# Patient Record
Sex: Female | Born: 1960 | Race: White | Hispanic: No | Smoking: Never smoker
Health system: Southern US, Community
[De-identification: ages and names within clinical notes are randomized; demographics above are authoritative.]

## PROBLEM LIST (undated history)

## (undated) DIAGNOSIS — K219 Gastro-esophageal reflux disease without esophagitis: Secondary | ICD-10-CM

## (undated) DIAGNOSIS — K30 Functional dyspepsia: Secondary | ICD-10-CM

## (undated) DIAGNOSIS — Z86010 Personal history of colonic polyps: Secondary | ICD-10-CM

## (undated) DIAGNOSIS — E785 Hyperlipidemia, unspecified: Secondary | ICD-10-CM

## (undated) HISTORY — DX: Functional dyspepsia: K30

## (undated) HISTORY — DX: Hyperlipidemia, unspecified: E78.5

## (undated) HISTORY — DX: Personal history of colonic polyps: Z86.010

## (undated) HISTORY — DX: Gastro-esophageal reflux disease without esophagitis: K21.9

---

## 2003-11-22 ENCOUNTER — Ambulatory Visit (HOSPITAL_COMMUNITY): Admission: RE | Admit: 2003-11-22 | Discharge: 2003-11-22 | Payer: Self-pay | Admitting: Infectious Diseases

## 2006-06-08 ENCOUNTER — Other Ambulatory Visit: Admission: RE | Admit: 2006-06-08 | Discharge: 2006-06-08 | Payer: Self-pay | Admitting: Family Medicine

## 2006-06-24 ENCOUNTER — Encounter: Admission: RE | Admit: 2006-06-24 | Discharge: 2006-06-24 | Payer: Self-pay | Admitting: Family Medicine

## 2006-07-01 ENCOUNTER — Encounter: Admission: RE | Admit: 2006-07-01 | Discharge: 2006-07-01 | Payer: Self-pay | Admitting: Family Medicine

## 2009-11-05 ENCOUNTER — Emergency Department (HOSPITAL_COMMUNITY): Admission: EM | Admit: 2009-11-05 | Discharge: 2009-11-05 | Payer: Self-pay | Admitting: Family Medicine

## 2013-03-09 HISTORY — PX: COLONOSCOPY: SHX174

## 2013-03-09 HISTORY — PX: POLYPECTOMY: SHX149

## 2013-06-21 ENCOUNTER — Other Ambulatory Visit: Payer: Self-pay | Admitting: Family Medicine

## 2013-06-21 ENCOUNTER — Other Ambulatory Visit (HOSPITAL_COMMUNITY)
Admission: RE | Admit: 2013-06-21 | Discharge: 2013-06-21 | Disposition: A | Discharge status: Home / Self Care | Payer: 59 | Source: Ambulatory Visit | Attending: Family Medicine | Admitting: Family Medicine

## 2013-06-21 DIAGNOSIS — Z124 Encounter for screening for malignant neoplasm of cervix: Secondary | ICD-10-CM | POA: Insufficient documentation

## 2013-06-29 ENCOUNTER — Encounter: Payer: Self-pay | Admitting: Internal Medicine

## 2013-07-07 ENCOUNTER — Ambulatory Visit (AMBULATORY_SURGERY_CENTER): Payer: Self-pay | Admitting: *Deleted

## 2013-07-07 VITALS — Ht 61.0 in | Wt 115.2 lb

## 2013-07-07 DIAGNOSIS — Z1211 Encounter for screening for malignant neoplasm of colon: Secondary | ICD-10-CM

## 2013-07-07 MED ORDER — PREPOPIK 10-3.5-12 MG-GM-GM PO PACK: 1.0000 | PACK | Freq: Once | ORAL | Status: DC

## 2013-07-07 NOTE — Progress Notes (Signed)
No allergies to eggs or soy. No problems with anesthesia.  Pt given Emmi instructions for colonoscopy  No oxygen use  No diet drug use  No previous colonoscopy

## 2013-07-21 ENCOUNTER — Ambulatory Visit (AMBULATORY_SURGERY_CENTER): Payer: 59 | Admitting: Internal Medicine

## 2013-07-21 ENCOUNTER — Encounter: Payer: Self-pay | Admitting: Internal Medicine

## 2013-07-21 VITALS — BP 108/66 | HR 67 | Temp 98.7°F | Resp 33 | Wt 115.0 lb

## 2013-07-21 DIAGNOSIS — K5730 Diverticulosis of large intestine without perforation or abscess without bleeding: Secondary | ICD-10-CM

## 2013-07-21 DIAGNOSIS — D126 Benign neoplasm of colon, unspecified: Secondary | ICD-10-CM

## 2013-07-21 DIAGNOSIS — Z1211 Encounter for screening for malignant neoplasm of colon: Secondary | ICD-10-CM

## 2013-07-21 MED ORDER — SODIUM CHLORIDE 0.9 % IV SOLN: 500.0000 mL | INTRAVENOUS | Status: DC

## 2013-07-21 NOTE — Patient Instructions (Addendum)
  I found and removed one tiny polyp that looks benign. You also have a condition called diverticulosis - common and not usually a problem. Please read the handout provided.  I will let you know pathology results and when to have another routine colonoscopy by mail.  I appreciate the opportunity to care for you. Zyad Boomer E. Jenia Klepper, MD, FACG  YOU HAD AN ENDOSCOPIC PROCEDURE TODAY AT THE [facility]: Refer to the procedure report that was given to you for any specific questions about what was found during the examination.  If the procedure report does not answer your questions, please call your gastroenterologist to clarify.  If you requested that your care partner not be given the details of your procedure findings, then the procedure report has been included in a sealed envelope for you to review at your convenience later.  YOU SHOULD EXPECT: Some feelings of bloating in the abdomen. Passage of more gas than usual.  Walking can help get rid of the air that was put into your GI tract during the procedure and reduce the bloating. If you had a lower endoscopy (such as a colonoscopy or flexible sigmoidoscopy) you may notice spotting of blood in your stool or on the toilet paper. If you underwent a bowel prep for your procedure, then you may not have a normal bowel movement for a few days.  DIET: Your first meal following the procedure should be a light meal and then it is ok to progress to your normal diet.  A half-sandwich or bowl of soup is an example of a good first meal.  Heavy or fried foods are harder to digest and may make you feel nauseous or bloated.  Likewise meals heavy in dairy and vegetables can cause extra gas to form and this can also increase the bloating.  Drink plenty of fluids but you should avoid alcoholic beverages for 24 hours.  ACTIVITY: Your care partner should take you home directly after the procedure.  You should plan to take it easy, moving slowly for the rest  of the day.  You can resume normal activity the day after the procedure however you should NOT DRIVE or use heavy machinery for 24 hours (because of the sedation medicines used during the test).    SYMPTOMS TO REPORT IMMEDIATELY: A gastroenterologist can be reached at any hour.  During normal business hours, 8:30 AM to 5:00 PM Monday through Friday, call [phone].  After hours and on weekends, please call the GI answering service at [phone] who will take a message and have the physician on call contact you.   Following lower endoscopy (colonoscopy or flexible sigmoidoscopy):  Excessive amounts of blood in the stool  Significant tenderness or worsening of abdominal pains  Swelling of the abdomen that is new, acute  Fever of 100F or higher  FOLLOW UP: If any biopsies were taken you will be contacted by phone or by letter within the next 1-3 weeks.  Call your gastroenterologist if you have not heard about the biopsies in 3 weeks.  Our staff will call the home number listed on your records the next business day following your procedure to check on you and address any questions or concerns that you may have at that time regarding the information given to you following your procedure. This is a courtesy call and so if there is no answer at the home number and we have not heard from you through the emergency physician on call, we   will assume that you have returned to your regular daily activities without incident.  SIGNATURES/CONFIDENTIALITY: You and/or your care partner have signed paperwork which will be entered into your electronic medical record.  These signatures attest to the fact that that the information above on your After Visit Summary has been reviewed and is understood.  Full responsibility of the confidentiality of this discharge information lies with you and/or your care-partner. 

## 2013-07-21 NOTE — Progress Notes (Signed)
Pt stable to RR 

## 2013-07-21 NOTE — Progress Notes (Signed)
Called to room to assist during endoscopic procedure.  Patient ID and intended procedure confirmed with present staff. Received instructions for my participation in the procedure from the performing physician.  

## 2013-07-21 NOTE — Op Note (Signed)
[place]  [address] [place], [id number]   COLONOSCOPY PROCEDURE REPORT  PATIENT: Christie Henderson, Christie Henderson  MR#: [id number] BIRTHDATE: September 04, 1960 , 52  yrs. old GENDER: Female ENDOSCOPIST: Gatha Mayer, MD, [facility] REFERRED [id number] Tamala Julian, M.D. PROCEDURE DATE:  07/21/2013 PROCEDURE:   Colonoscopy with biopsy First Screening Colonoscopy - Avg.  risk and is 50 yrs.  old or older Yes.  Prior Negative Screening - Now for repeat screening. N/A  History of Adenoma - Now for follow-up colonoscopy & has been > or = to 3 yrs.  N/A  Polyps Removed Today? Yes. ASA CLASS:   Class I INDICATIONS:average risk screening and first colonoscopy. MEDICATIONS: propofol (Diprivan) 250mg  IV, MAC sedation, administered by CRNA, and These medications were titrated to patient response per physician's verbal order  DESCRIPTION OF PROCEDURE:   After the risks benefits and alternatives of the procedure were thoroughly explained, informed consent was obtained.  A digital rectal exam revealed no abnormalities of the rectum.   The LB [id number] and LB [id number]  endoscope was introduced through the anus and advanced to the cecum, which was identified by both the appendix and ileocecal valve. No adverse events experienced.   The quality of the prep was excellent using Suprep  The instrument was then slowly withdrawn as the colon was fully examined.  COLON FINDINGS: A diminutive sessile polyp was found at the cecum. A polypectomy was performed with cold forceps.  The resection was complete and the polyp tissue was completely retrieved.   There was severe diverticulosis noted in the sigmoid colon with associated angulation and muscular hypertrophy.   The colon mucosa was otherwise normal.  Retroflexed views revealed no abnormalities. The time to cecum=6 minutes 57 seconds.  Withdrawal time=9 minutes 30 seconds.  The scope was withdrawn and the procedure completed. COMPLICATIONS: There  were no complications.  ENDOSCOPIC IMPRESSION: 1.   Diminutive sessile polyp was found at the cecum; polypectomy was performed with cold forceps 2.   There was severe diverticulosis noted in the sigmoid colon 3.   The colon mucosa was otherwise normal - excellent prep - first colonoscopy  RECOMMENDATIONS: Timing of repeat colonoscopy will be determined by pathology findings. Likely 5-10 years   eSigned:  Gatha Mayer, MD, [facility] 07/21/2013 10:32 AM   cc: The Patient and Carol Ada, MD

## 2013-07-24 ENCOUNTER — Telehealth: Payer: Self-pay

## 2013-07-24 NOTE — Telephone Encounter (Signed)
  Follow up Call-  Call back number 07/21/2013  Post procedure Call Back phone  # [phone] cell  Permission to leave phone message Yes     Patient questions:  Do you have a fever, pain , or abdominal swelling? no Pain Score  0 *  Have you tolerated food without any problems? yes  Have you been able to return to your normal activities? yes  Do you have any questions about your discharge instructions: Diet   no Medications  no Follow up visit  no  Do you have questions or concerns about your Care? no  Actions: * If pain score is 4 or above: No action needed, pain <4.

## 2013-07-25 ENCOUNTER — Other Ambulatory Visit: Payer: Self-pay

## 2013-07-25 DIAGNOSIS — Z1231 Encounter for screening mammogram for malignant neoplasm of breast: Secondary | ICD-10-CM

## 2013-07-27 ENCOUNTER — Encounter: Payer: Self-pay | Admitting: Internal Medicine

## 2013-07-27 DIAGNOSIS — Z860100 Personal history of colon polyps, unspecified: Secondary | ICD-10-CM

## 2013-07-27 HISTORY — DX: Personal history of colonic polyps: Z86.010

## 2013-07-27 HISTORY — DX: Personal history of colon polyps, unspecified: Z86.0100

## 2013-07-27 NOTE — Progress Notes (Signed)
Quick Note:  Diminutive adenoma - repeat colonoscopy 7 years 2022 ______

## 2013-08-04 ENCOUNTER — Ambulatory Visit
Admission: RE | Admit: 2013-08-04 | Discharge: 2013-08-04 | Disposition: A | Discharge status: Home / Self Care | Payer: 59 | Source: Ambulatory Visit

## 2013-08-04 DIAGNOSIS — Z1231 Encounter for screening mammogram for malignant neoplasm of breast: Secondary | ICD-10-CM

## 2013-08-07 ENCOUNTER — Other Ambulatory Visit: Payer: Self-pay | Admitting: Family Medicine

## 2013-08-07 DIAGNOSIS — R928 Other abnormal and inconclusive findings on diagnostic imaging of breast: Secondary | ICD-10-CM

## 2013-08-16 ENCOUNTER — Ambulatory Visit
Admission: RE | Admit: 2013-08-16 | Discharge: 2013-08-16 | Disposition: A | Discharge status: Home / Self Care | Payer: 59 | Source: Ambulatory Visit | Attending: Family Medicine | Admitting: Family Medicine

## 2013-08-16 DIAGNOSIS — R928 Other abnormal and inconclusive findings on diagnostic imaging of breast: Secondary | ICD-10-CM

## 2015-04-29 ENCOUNTER — Other Ambulatory Visit: Payer: Self-pay

## 2015-04-29 DIAGNOSIS — Z1231 Encounter for screening mammogram for malignant neoplasm of breast: Secondary | ICD-10-CM

## 2015-05-06 DIAGNOSIS — H52223 Regular astigmatism, bilateral: Secondary | ICD-10-CM | POA: Diagnosis not present

## 2015-05-06 DIAGNOSIS — H524 Presbyopia: Secondary | ICD-10-CM | POA: Diagnosis not present

## 2015-05-06 DIAGNOSIS — H5203 Hypermetropia, bilateral: Secondary | ICD-10-CM | POA: Diagnosis not present

## 2015-05-10 ENCOUNTER — Ambulatory Visit
Admission: RE | Admit: 2015-05-10 | Discharge: 2015-05-10 | Disposition: A | Discharge status: Home / Self Care | Payer: 59 | Source: Ambulatory Visit

## 2015-05-10 DIAGNOSIS — Z1231 Encounter for screening mammogram for malignant neoplasm of breast: Secondary | ICD-10-CM

## 2015-05-28 DIAGNOSIS — L821 Other seborrheic keratosis: Secondary | ICD-10-CM | POA: Diagnosis not present

## 2015-05-28 DIAGNOSIS — L720 Epidermal cyst: Secondary | ICD-10-CM | POA: Diagnosis not present

## 2015-05-28 DIAGNOSIS — D2262 Melanocytic nevi of left upper limb, including shoulder: Secondary | ICD-10-CM | POA: Diagnosis not present

## 2015-05-28 DIAGNOSIS — D225 Melanocytic nevi of trunk: Secondary | ICD-10-CM | POA: Diagnosis not present

## 2015-05-28 DIAGNOSIS — D2261 Melanocytic nevi of right upper limb, including shoulder: Secondary | ICD-10-CM | POA: Diagnosis not present

## 2015-05-28 DIAGNOSIS — D1801 Hemangioma of skin and subcutaneous tissue: Secondary | ICD-10-CM | POA: Diagnosis not present

## 2015-05-28 DIAGNOSIS — D2272 Melanocytic nevi of left lower limb, including hip: Secondary | ICD-10-CM | POA: Diagnosis not present

## 2015-06-05 DIAGNOSIS — Z0000 Encounter for general adult medical examination without abnormal findings: Secondary | ICD-10-CM | POA: Diagnosis not present

## 2015-06-05 DIAGNOSIS — Z13220 Encounter for screening for lipoid disorders: Secondary | ICD-10-CM | POA: Diagnosis not present

## 2015-06-05 DIAGNOSIS — R3915 Urgency of urination: Secondary | ICD-10-CM | POA: Diagnosis not present

## 2015-06-05 MED FILL — NITROFURANTOIN MONO-MCR 100: 100 | 7 days supply | Qty: 14 | Fill #0

## 2015-11-06 DIAGNOSIS — R350 Frequency of micturition: Secondary | ICD-10-CM | POA: Diagnosis not present

## 2015-11-06 MED FILL — NITROFURANTOIN MONO-MCR 100: 100 | 7 days supply | Qty: 14 | Fill #0

## 2016-05-28 DIAGNOSIS — D225 Melanocytic nevi of trunk: Secondary | ICD-10-CM | POA: Diagnosis not present

## 2016-05-28 DIAGNOSIS — L821 Other seborrheic keratosis: Secondary | ICD-10-CM | POA: Diagnosis not present

## 2016-05-28 DIAGNOSIS — L218 Other seborrheic dermatitis: Secondary | ICD-10-CM | POA: Diagnosis not present

## 2016-05-28 DIAGNOSIS — D1801 Hemangioma of skin and subcutaneous tissue: Secondary | ICD-10-CM | POA: Diagnosis not present

## 2016-05-28 DIAGNOSIS — L853 Xerosis cutis: Secondary | ICD-10-CM | POA: Diagnosis not present

## 2016-05-28 DIAGNOSIS — D2261 Melanocytic nevi of right upper limb, including shoulder: Secondary | ICD-10-CM | POA: Diagnosis not present

## 2016-05-28 DIAGNOSIS — D2272 Melanocytic nevi of left lower limb, including hip: Secondary | ICD-10-CM | POA: Diagnosis not present

## 2016-05-28 DIAGNOSIS — D2239 Melanocytic nevi of other parts of face: Secondary | ICD-10-CM | POA: Diagnosis not present

## 2017-09-01 ENCOUNTER — Other Ambulatory Visit (HOSPITAL_COMMUNITY)
Admission: RE | Admit: 2017-09-01 | Discharge: 2017-09-01 | Disposition: A | Discharge status: Home / Self Care | Payer: 59 | Source: Ambulatory Visit | Attending: Physician Assistant | Admitting: Physician Assistant

## 2017-09-01 ENCOUNTER — Other Ambulatory Visit: Payer: Self-pay | Admitting: Physician Assistant

## 2017-09-01 DIAGNOSIS — Z136 Encounter for screening for cardiovascular disorders: Secondary | ICD-10-CM | POA: Diagnosis not present

## 2017-09-01 DIAGNOSIS — Z0000 Encounter for general adult medical examination without abnormal findings: Secondary | ICD-10-CM | POA: Diagnosis not present

## 2017-09-01 DIAGNOSIS — Z23 Encounter for immunization: Secondary | ICD-10-CM | POA: Diagnosis not present

## 2017-09-06 LAB — CYTOLOGY - PAP
ADEQUACY: ABSENT
DIAGNOSIS: NEGATIVE
HPV: DETECTED — AB

## 2018-09-05 ENCOUNTER — Other Ambulatory Visit: Payer: Self-pay | Admitting: Physician Assistant

## 2018-09-05 ENCOUNTER — Other Ambulatory Visit (HOSPITAL_COMMUNITY)
Admission: RE | Admit: 2018-09-05 | Discharge: 2018-09-05 | Disposition: A | Discharge status: Home / Self Care | Payer: 59 | Source: Ambulatory Visit | Attending: Physician Assistant | Admitting: Physician Assistant

## 2018-09-05 DIAGNOSIS — Z0000 Encounter for general adult medical examination without abnormal findings: Secondary | ICD-10-CM | POA: Diagnosis not present

## 2018-09-05 DIAGNOSIS — E785 Hyperlipidemia, unspecified: Secondary | ICD-10-CM | POA: Diagnosis not present

## 2018-09-07 LAB — CYTOLOGY - PAP
Diagnosis: NEGATIVE
HPV: DETECTED — AB

## 2019-09-18 ENCOUNTER — Other Ambulatory Visit (HOSPITAL_COMMUNITY)
Admission: RE | Admit: 2019-09-18 | Discharge: 2019-09-18 | Disposition: A | Discharge status: Home / Self Care | Payer: 59 | Source: Ambulatory Visit | Attending: Physician Assistant | Admitting: Physician Assistant

## 2019-09-18 ENCOUNTER — Other Ambulatory Visit: Payer: Self-pay | Admitting: Physician Assistant

## 2019-09-18 DIAGNOSIS — Z0000 Encounter for general adult medical examination without abnormal findings: Secondary | ICD-10-CM | POA: Diagnosis not present

## 2019-09-18 DIAGNOSIS — Z1231 Encounter for screening mammogram for malignant neoplasm of breast: Secondary | ICD-10-CM

## 2019-09-18 DIAGNOSIS — E785 Hyperlipidemia, unspecified: Secondary | ICD-10-CM | POA: Diagnosis not present

## 2019-09-20 LAB — CYTOLOGY - PAP
Comment: NEGATIVE
Diagnosis: NEGATIVE
High risk HPV: NEGATIVE

## 2019-09-27 ENCOUNTER — Ambulatory Visit: Payer: 59

## 2019-10-09 DIAGNOSIS — R945 Abnormal results of liver function studies: Secondary | ICD-10-CM | POA: Diagnosis not present

## 2019-10-20 ENCOUNTER — Ambulatory Visit
Admission: RE | Admit: 2019-10-20 | Discharge: 2019-10-20 | Disposition: A | Discharge status: Home / Self Care | Payer: 59 | Source: Ambulatory Visit | Attending: Physician Assistant | Admitting: Physician Assistant

## 2019-10-20 ENCOUNTER — Other Ambulatory Visit: Payer: Self-pay

## 2019-10-20 DIAGNOSIS — Z1231 Encounter for screening mammogram for malignant neoplasm of breast: Secondary | ICD-10-CM

## 2020-01-15 DIAGNOSIS — E785 Hyperlipidemia, unspecified: Secondary | ICD-10-CM | POA: Diagnosis not present

## 2020-09-19 DIAGNOSIS — Z0000 Encounter for general adult medical examination without abnormal findings: Secondary | ICD-10-CM | POA: Diagnosis not present

## 2020-09-19 DIAGNOSIS — E785 Hyperlipidemia, unspecified: Secondary | ICD-10-CM | POA: Diagnosis not present

## 2020-11-19 ENCOUNTER — Encounter: Payer: Self-pay | Admitting: Internal Medicine

## 2021-09-26 DIAGNOSIS — Z13220 Encounter for screening for lipoid disorders: Secondary | ICD-10-CM | POA: Diagnosis not present

## 2021-09-26 DIAGNOSIS — R002 Palpitations: Secondary | ICD-10-CM | POA: Diagnosis not present

## 2021-09-26 DIAGNOSIS — Z1211 Encounter for screening for malignant neoplasm of colon: Secondary | ICD-10-CM | POA: Diagnosis not present

## 2021-09-26 DIAGNOSIS — Z0000 Encounter for general adult medical examination without abnormal findings: Secondary | ICD-10-CM | POA: Diagnosis not present

## 2021-11-12 ENCOUNTER — Other Ambulatory Visit: Payer: Self-pay | Admitting: Family Medicine

## 2021-11-12 DIAGNOSIS — Z1231 Encounter for screening mammogram for malignant neoplasm of breast: Secondary | ICD-10-CM

## 2021-11-14 ENCOUNTER — Ambulatory Visit
Admission: RE | Admit: 2021-11-14 | Discharge: 2021-11-14 | Disposition: A | Discharge status: Home / Self Care | Payer: 59 | Source: Ambulatory Visit | Attending: Family Medicine | Admitting: Family Medicine

## 2021-11-14 DIAGNOSIS — Z1231 Encounter for screening mammogram for malignant neoplasm of breast: Secondary | ICD-10-CM

## 2022-01-18 NOTE — Progress Notes (Unsigned)
Cardiology Office Note:    Date:  01/19/2022   ID:  Christie Henderson, DOB [birth year], MRN [id number]  PCP:  Carol Ada, MD  Cardiologist:  None  Electrophysiologist:  None   Referring MD: Caren Macadam, MD   Chief Complaint  Patient presents with   Palpitations    History of Present Illness:    Christie Henderson is a 61 y.o. female with a hx of hyperlipidemia who is referred by Dr. Mannie Stabile for evaluation of palpitations.  She reports she has been having intermittent palpitations.  Thinks it is likely PVCs.  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  Describes as feeling like heart skips beats.  Was occurring more frequently but has improved recently.  She denies any chest pain, dyspnea, lightheadedness, syncope, lower extremity edema, or palpitations.  Reports she walks at least 3 days/week for 30 to 45 minutes, denies any exertional symptoms.  No smoking history.  Family history includes maternal grandfather had MI in 13s.   Past Medical History:  Diagnosis Date   Indigestion    Personal history of colonic polyp - adenoma 07/27/2013    Past Surgical History:  Procedure Laterality Date   CESAREAN SECTION  2004    Current Medications: Current Meds  Medication Sig   calcium carbonate (TUMS - DOSED IN MG ELEMENTAL CALCIUM) 500 MG chewable tablet Chew 1 tablet by mouth as needed for indigestion or heartburn.   ibuprofen (ADVIL,MOTRIN) 200 MG tablet Take 200 mg by mouth every 6 (six) hours as needed.     Allergies:   Patient has no known allergies.   Social History   Socioeconomic History   Marital status: Legally Separated    Spouse name: Not on file   Number of children: Not on file   Years of education: Not on file   Highest education level: Not on file  Occupational History   Not on file  Tobacco Use   Smoking status: Never   Smokeless tobacco: Never  Substance and Sexual Activity   Alcohol use: Yes     Alcohol/week: 2.0 standard drinks of alcohol    Types: 2 Cans of beer per week   Drug use: No   Sexual activity: Not on file  Other Topics Concern   Not on file  Social History Narrative   Not on file   Social Determinants of Health   Financial Resource Strain: Not on file  Food Insecurity: Not on file  Transportation Needs: Not on file  Physical Activity: Not on file  Stress: Not on file  Social Connections: Not on file     Family History: The patient's family history includes Colon cancer in an other family member. There is no history of Esophageal cancer, Pancreatic cancer, Rectal cancer, or Stomach cancer.  ROS:   Please see the history of present illness.     All other systems reviewed and are negative.  EKGs/Labs/Other Studies Reviewed:    The following studies were reviewed today:   EKG:   01/19/2022: Normal sinus rhythm, rate 63, no ST abnormality  Recent Labs: No results found for requested labs within last 365 days.  Recent Lipid Panel No results found for: "CHOL", "TRIG", "HDL", "CHOLHDL", "VLDL", "LDLCALC", "LDLDIRECT"  Physical Exam:    VS:  BP 132/80   Pulse 63   Ht '5\' 1"'$  (1.549 m)   Wt 129 lb 6.4 oz (58.7 kg)   LMP 07/17/2013  SpO2 95%   BMI 24.45 kg/m     Wt Readings from Last 3 Encounters:  01/19/22 129 lb 6.4 oz (58.7 kg)  07/21/13 115 lb (52.2 kg)  07/07/13 115 lb 3.2 oz (52.3 kg)     GEN:  Well nourished, well developed in no acute distress HEENT: Normal NECK: No JVD; No carotid bruits LYMPHATICS: No lymphadenopathy CARDIAC: RRR, no murmurs, rubs, gallops RESPIRATORY:  Clear to auscultation without rales, wheezing or rhonchi  ABDOMEN: Soft, non-tender, non-distended MUSCULOSKELETAL:  No edema; No deformity  SKIN: Warm and dry NEUROLOGIC:  Alert and oriented x 3 PSYCHIATRIC:  Normal affect   ASSESSMENT:    1. Palpitations   2. Hyperlipidemia, unspecified hyperlipidemia type    PLAN:    Palpitations: Description suggests  likely PACs/PVCs.  Discussed monitor to quantify burden of ectopy but she reports her symptoms have improved and wishes to hold off for now.  If palpitations worsen we can plan for cardiac monitor  Hyperlipidemia: LDL 169 on 09/26/2021.  10-year ASCVD risk score 4%, not meeting indication for statin at this time.  Recommend calcium score for further risk stratification   RTC in 1 year  Medication Adjustments/Labs and Tests Ordered: Current medicines are reviewed at length with the patient today.  Concerns regarding medicines are outlined above.  Orders Placed This Encounter  Procedures   CT CARDIAC SCORING (SELF PAY ONLY)   EKG 12-Lead   No orders of the defined types were placed in this encounter.   Patient Instructions  Medication Instructions:  Your physician recommends that you continue on your current medications as directed. Please refer to the Current Medication list given to you today.  *If you need a refill on your cardiac medications before your next appointment, please call your pharmacy*  Testing/Procedures: CT coronary calcium score.   Test locations:  [place]   This is $99 out of pocket.   Coronary CalciumScan A coronary calcium scan is an imaging test used to look for deposits of calcium and other fatty materials (plaques) in the inner lining of the blood vessels of the heart (coronary arteries). These deposits of calcium and plaques can partly clog and narrow the coronary arteries without producing any symptoms or warning signs. This puts a person at risk for a heart attack. This test can detect these deposits before symptoms develop. Tell a health care provider about: Any allergies you have. All medicines you are taking, including vitamins, herbs, eye drops, creams, and over-the-counter medicines. Any problems you or family members have had with anesthetic medicines. Any blood disorders you have. Any surgeries you have  had. Any medical conditions you have. Whether you are pregnant or may be pregnant. What are the risks? Generally, this is a safe procedure. However, problems may occur, including: Harm to a pregnant woman and her unborn baby. This test involves the use of radiation. Radiation exposure can be dangerous to a pregnant woman and her unborn baby. If you are pregnant, you generally should not have this procedure done. Slight increase in the risk of cancer. This is because of the radiation involved in the test. What happens before the procedure? No preparation is needed for this procedure. What happens during the procedure? You will undress and remove any jewelry around your neck or chest. You will put on a hospital gown. Sticky electrodes will be placed on your chest. The electrodes will be connected to an electrocardiogram (ECG) machine to record a tracing of the electrical activity  of your heart. A CT scanner will take pictures of your heart. During this time, you will be asked to lie still and hold your breath for 2-3 seconds while a picture of your heart is being taken. The procedure may vary among health care providers and hospitals. What happens after the procedure? You can get dressed. You can return to your normal activities. It is up to you to get the results of your test. Ask your health care provider, or the department that is doing the test, when your results will be ready. Summary A coronary calcium scan is an imaging test used to look for deposits of calcium and other fatty materials (plaques) in the inner lining of the blood vessels of the heart (coronary arteries). Generally, this is a safe procedure. Tell your health care provider if you are pregnant or may be pregnant. No preparation is needed for this procedure. A CT scanner will take pictures of your heart. You can return to your normal activities after the scan is done. This information is not intended to replace advice given  to you by your health care provider. Make sure you discuss any questions you have with your health care provider. Document Released: 08/22/2007 Document Revised: 01/13/2016 Document Reviewed: 01/13/2016 Elsevier Interactive Patient Education  2017 [place]: At [facility], you and your health needs are our priority.  As part of our continuing mission to provide you with exceptional heart care, we have created designated Provider Care Teams.  These Care Teams include your primary Cardiologist (physician) and Advanced Practice Providers (APPs -  Physician Assistants and Nurse Practitioners) who all work together to provide you with the care you need, when you need it.  We recommend signing up for the patient portal called "MyChart".  Sign up information is provided on this After Visit Summary.  MyChart is used to connect with patients for Virtual Visits (Telemedicine).  Patients are able to view lab/test results, encounter notes, upcoming appointments, etc.  Non-urgent messages can be sent to your provider as well.   To learn more about what you can do with MyChart, go to [email or web address].    Your next appointment:   12 month(s)  The format for your next appointment:   In Person  Provider:   Dr. Gardiner Rhyme      Signed, Donato Heinz, MD  01/19/2022 1:26 PM    [place], [place]

## 2022-01-19 ENCOUNTER — Ambulatory Visit: Payer: 59 | Attending: Cardiology | Admitting: Cardiology

## 2022-01-19 ENCOUNTER — Encounter: Payer: Self-pay | Admitting: Cardiology

## 2022-01-19 VITALS — BP 132/80 | HR 63 | Ht 61.0 in | Wt 129.4 lb

## 2022-01-19 DIAGNOSIS — R002 Palpitations: Secondary | ICD-10-CM | POA: Diagnosis not present

## 2022-01-19 DIAGNOSIS — E785 Hyperlipidemia, unspecified: Secondary | ICD-10-CM | POA: Diagnosis not present

## 2022-01-19 NOTE — Patient Instructions (Signed)
Medication Instructions:  Your physician recommends that you continue on your current medications as directed. Please refer to the Current Medication list given to you today.  *If you need a refill on your cardiac medications before your next appointment, please call your pharmacy*  Testing/Procedures: CT coronary calcium score.   Test locations:  [place]   This is $99 out of pocket.   Coronary CalciumScan A coronary calcium scan is an imaging test used to look for deposits of calcium and other fatty materials (plaques) in the inner lining of the blood vessels of the heart (coronary arteries). These deposits of calcium and plaques can partly clog and narrow the coronary arteries without producing any symptoms or warning signs. This puts a person at risk for a heart attack. This test can detect these deposits before symptoms develop. Tell a health care provider about: Any allergies you have. All medicines you are taking, including vitamins, herbs, eye drops, creams, and over-the-counter medicines. Any problems you or family members have had with anesthetic medicines. Any blood disorders you have. Any surgeries you have had. Any medical conditions you have. Whether you are pregnant or may be pregnant. What are the risks? Generally, this is a safe procedure. However, problems may occur, including: Harm to a pregnant woman and her unborn baby. This test involves the use of radiation. Radiation exposure can be dangerous to a pregnant woman and her unborn baby. If you are pregnant, you generally should not have this procedure done. Slight increase in the risk of cancer. This is because of the radiation involved in the test. What happens before the procedure? No preparation is needed for this procedure. What happens during the procedure? You will undress and remove any jewelry around your neck or chest. You will put on a hospital gown. Sticky electrodes  will be placed on your chest. The electrodes will be connected to an electrocardiogram (ECG) machine to record a tracing of the electrical activity of your heart. A CT scanner will take pictures of your heart. During this time, you will be asked to lie still and hold your breath for 2-3 seconds while a picture of your heart is being taken. The procedure may vary among health care providers and hospitals. What happens after the procedure? You can get dressed. You can return to your normal activities. It is up to you to get the results of your test. Ask your health care provider, or the department that is doing the test, when your results will be ready. Summary A coronary calcium scan is an imaging test used to look for deposits of calcium and other fatty materials (plaques) in the inner lining of the blood vessels of the heart (coronary arteries). Generally, this is a safe procedure. Tell your health care provider if you are pregnant or may be pregnant. No preparation is needed for this procedure. A CT scanner will take pictures of your heart. You can return to your normal activities after the scan is done. This information is not intended to replace advice given to you by your health care provider. Make sure you discuss any questions you have with your health care provider. Document Released: 08/22/2007 Document Revised: 01/13/2016 Document Reviewed: 01/13/2016 Elsevier Interactive Patient Education  2017 [place]: At [facility], you and your health needs are our priority.  As part of our continuing mission to provide you with exceptional heart care, we have created designated Provider Care Teams.  These Care Teams  include your primary Cardiologist (physician) and Advanced Practice Providers (APPs -  Physician Assistants and Nurse Practitioners) who all work together to provide you with the care you need, when you need it.  We recommend signing up for the patient  portal called "MyChart".  Sign up information is provided on this After Visit Summary.  MyChart is used to connect with patients for Virtual Visits (Telemedicine).  Patients are able to view lab/test results, encounter notes, upcoming appointments, etc.  Non-urgent messages can be sent to your provider as well.   To learn more about what you can do with MyChart, go to [email or web address].    Your next appointment:   12 month(s)  The format for your next appointment:   In Person  Provider:   Dr. Gardiner Rhyme

## 2022-02-27 ENCOUNTER — Ambulatory Visit (HOSPITAL_BASED_OUTPATIENT_CLINIC_OR_DEPARTMENT_OTHER)
Admission: RE | Admit: 2022-02-27 | Discharge: 2022-02-27 | Disposition: A | Discharge status: Home / Self Care | Payer: 59 | Source: Ambulatory Visit | Attending: Cardiology | Admitting: Cardiology

## 2022-02-27 DIAGNOSIS — E785 Hyperlipidemia, unspecified: Secondary | ICD-10-CM | POA: Insufficient documentation

## 2022-04-02 ENCOUNTER — Encounter: Payer: Self-pay | Admitting: Internal Medicine

## 2022-04-14 ENCOUNTER — Ambulatory Visit (AMBULATORY_SURGERY_CENTER): Payer: Commercial Managed Care - PPO

## 2022-04-14 ENCOUNTER — Other Ambulatory Visit (HOSPITAL_COMMUNITY): Payer: Self-pay

## 2022-04-14 VITALS — Ht 61.0 in | Wt 125.0 lb

## 2022-04-14 DIAGNOSIS — Z86010 Personal history of colonic polyps: Secondary | ICD-10-CM

## 2022-04-14 MED ORDER — NA SULFATE-K SULFATE-MG SULF 17.5-3.13-1.6 GM/177ML PO SOLN
1.0000 | Freq: Once | ORAL | 0 refills | Status: AC
  Filled 2022-04-14: qty 354, 2d supply, fill #0

## 2022-04-14 NOTE — Progress Notes (Signed)
Pre visit completed via phone call; Patient verified name, DOB, and address;  No egg or soy allergy known to patient  No issues known to pt with past sedation with any surgeries or procedures Patient denies ever being told they had issues or difficulty with intubation;  No FH of Malignant Hyperthermia; Pt is not on diet pills; Pt is not on home 02;  Pt is not on blood thinners;  Pt denies issues with constipation;  No A fib or A flutter; Have any cardiac testing pending--NO Pt instructed to use [email or web address] or GoodRx for a price reduction on prep   Insurance verified during [facility] appt=[facility]  Patient's chart reviewed by Osvaldo Angst CNRA prior to previsit and patient appropriate for the [place].  Previsit completed and red dot placed by patient's name on their procedure day (on provider's schedule).

## 2022-04-15 ENCOUNTER — Other Ambulatory Visit (HOSPITAL_COMMUNITY): Payer: Self-pay

## 2022-04-17 ENCOUNTER — Encounter: Payer: Self-pay | Admitting: Internal Medicine

## 2022-04-29 ENCOUNTER — Encounter: Payer: Commercial Managed Care - PPO | Admitting: Internal Medicine

## 2022-08-01 IMAGING — MG DIGITAL SCREENING BILAT W/ TOMO W/ CAD
8 series · 8 of 24 positions shown · non-contrast
Comparison: Previous exam(s).

CLINICAL DATA: Screening.

EXAM:
DIGITAL SCREENING BILATERAL MAMMOGRAM WITH TOMO AND CAD

[L CC synth-2D]
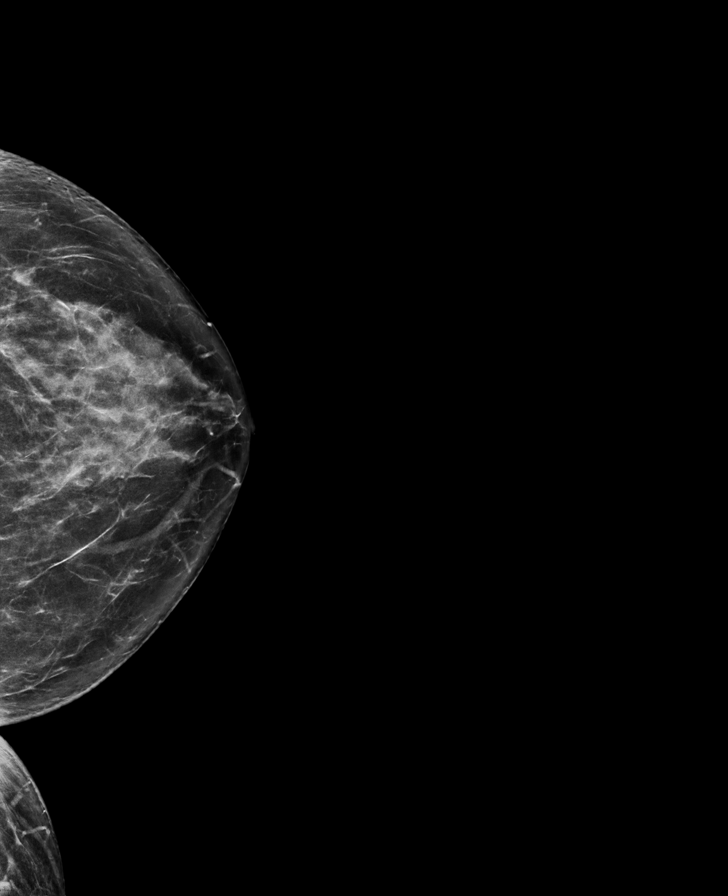

[R MLO synth-2D]
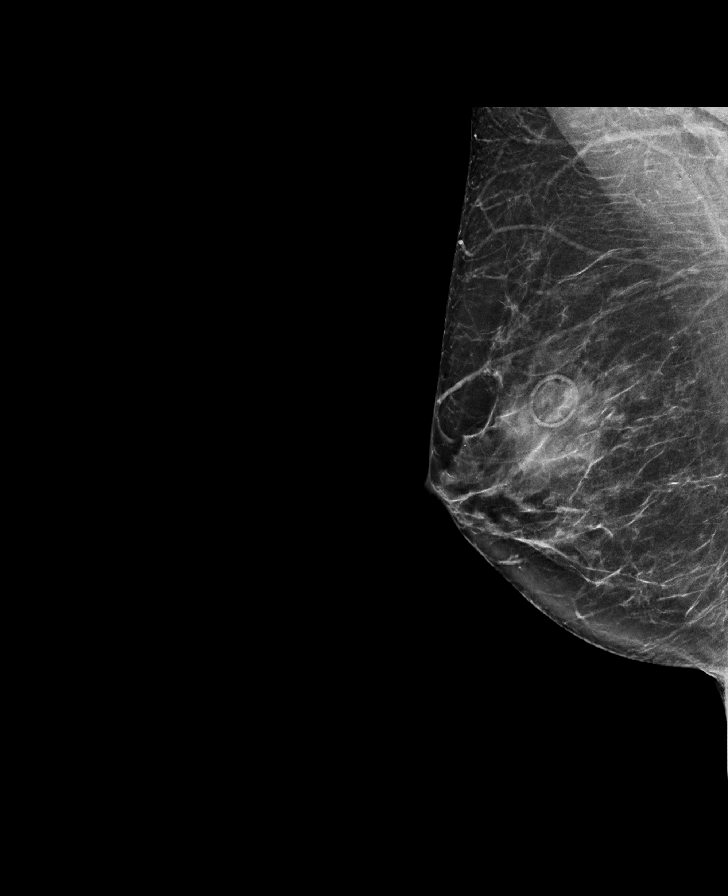

[L MLO synth-2D]
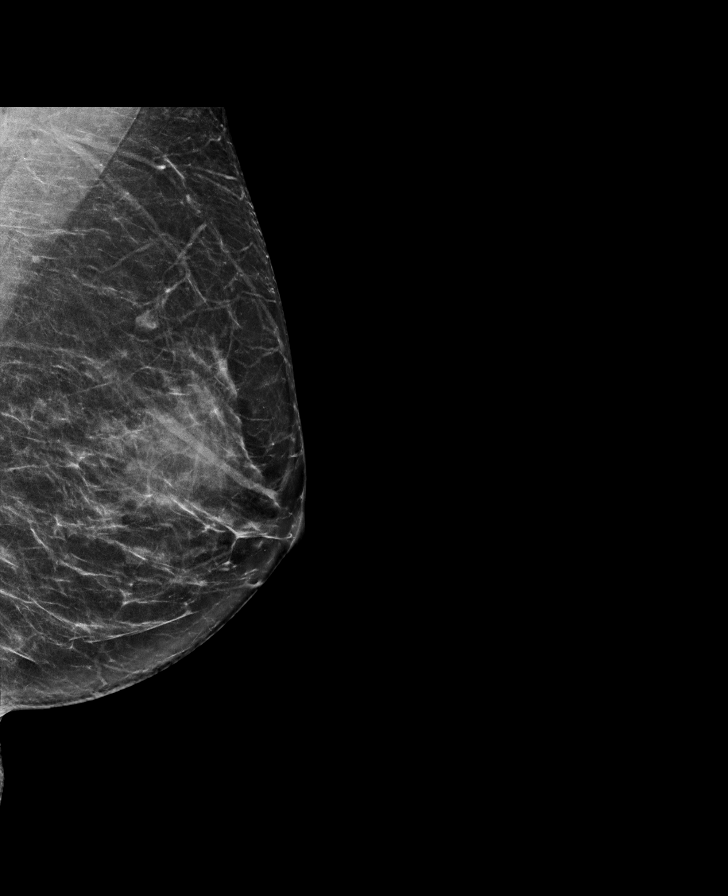

[R CC synth-2D]
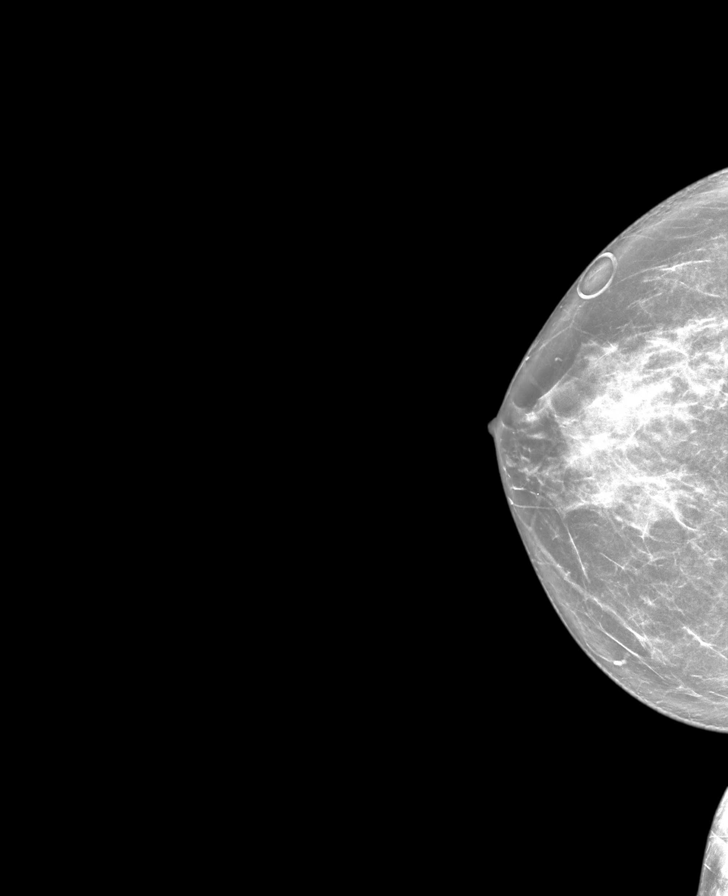

[L MLO tomo · tomo slice 35/70.0]
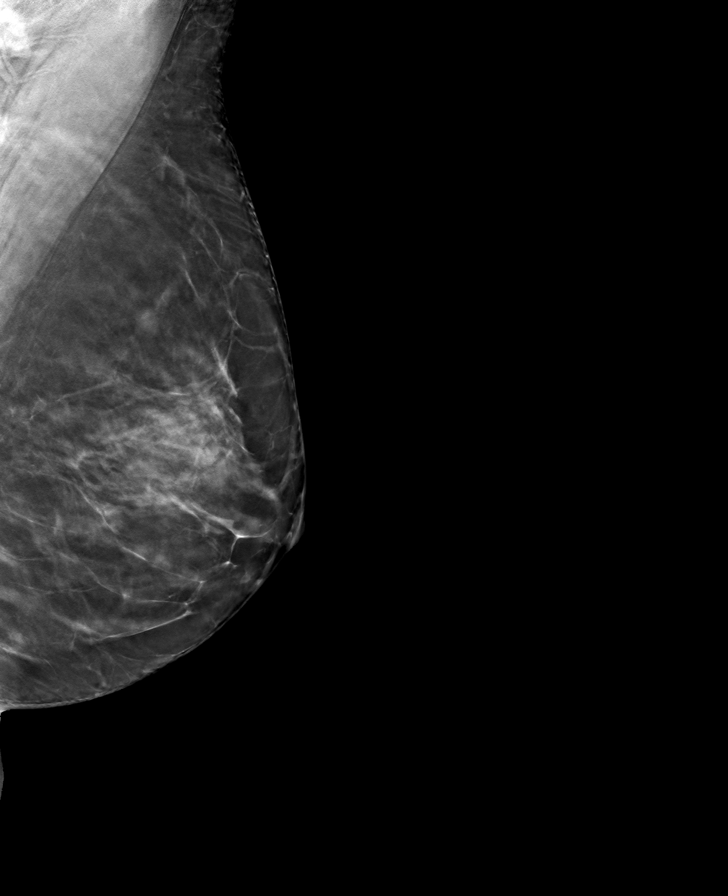

[L CC tomo · tomo slice 35/68.0]
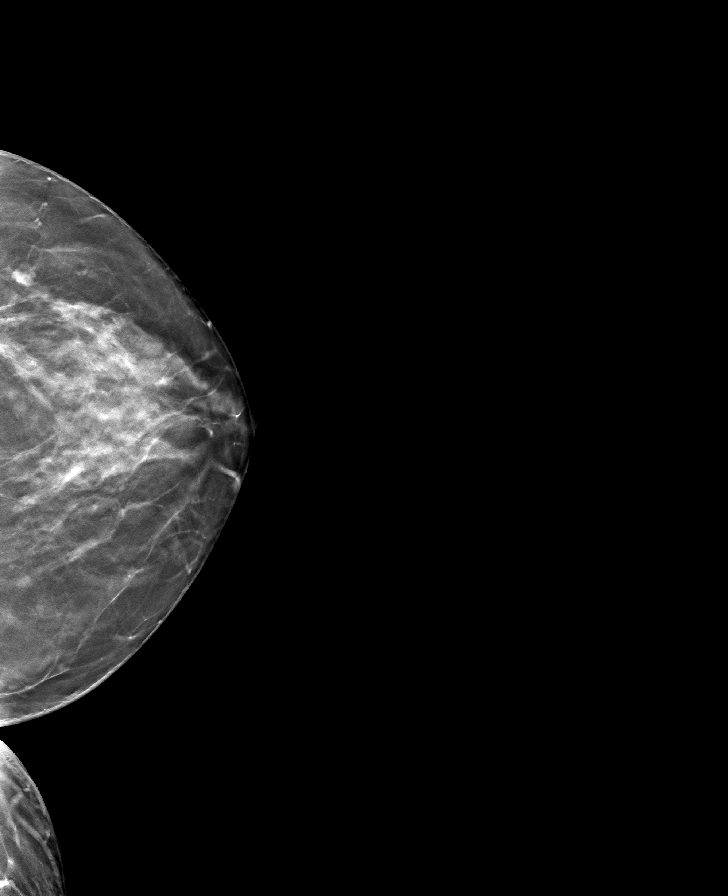

[R CC tomo · tomo slice 35/69.0]
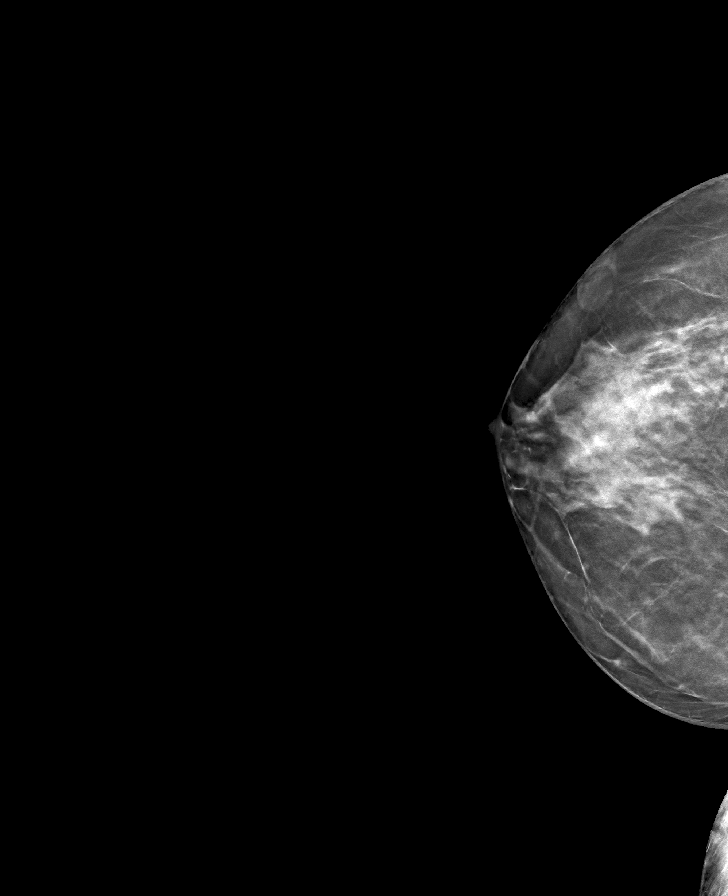

[R MLO tomo · tomo slice 39/76.0]
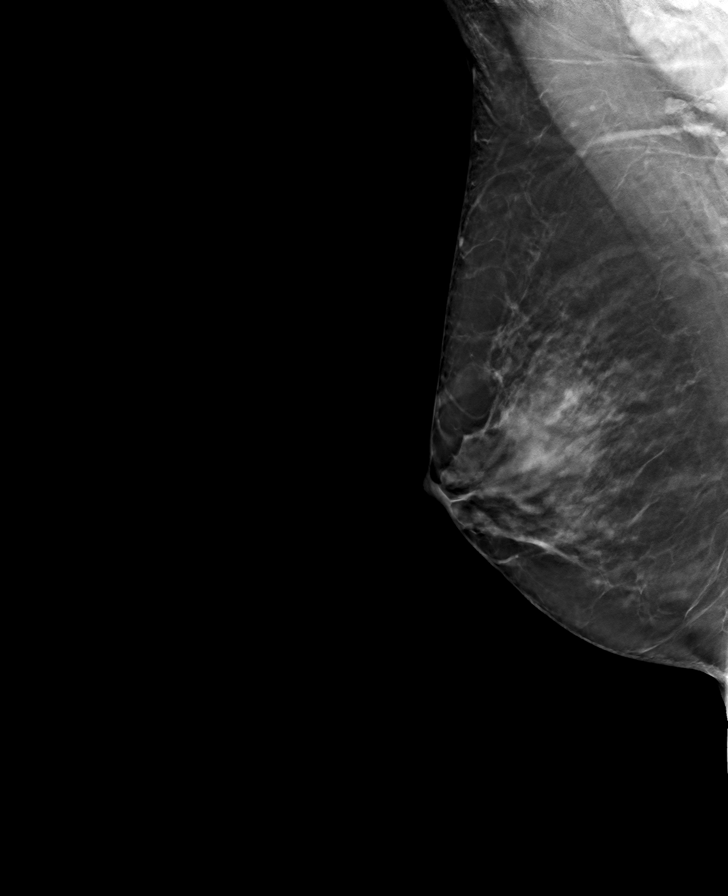

[8 of 24 positions shown; findings below may reference images not displayed]

ACR Breast Density Category c: The breast tissue is heterogeneously
dense, which may obscure small masses.
FINDINGS: There are no findings suspicious for malignancy. Images were
processed with CAD.
IMPRESSION: No mammographic evidence of malignancy. A result letter of this
screening mammogram will be mailed directly to the patient.

RECOMMENDATION:
Screening mammogram in one year. (Code:[id number])

BI-RADS CATEGORY  1: Negative.

## 2022-10-07 DIAGNOSIS — E785 Hyperlipidemia, unspecified: Secondary | ICD-10-CM | POA: Diagnosis not present

## 2022-10-07 DIAGNOSIS — L989 Disorder of the skin and subcutaneous tissue, unspecified: Secondary | ICD-10-CM | POA: Diagnosis not present

## 2022-10-07 DIAGNOSIS — Z13220 Encounter for screening for lipoid disorders: Secondary | ICD-10-CM | POA: Diagnosis not present

## 2022-10-07 DIAGNOSIS — Z0000 Encounter for general adult medical examination without abnormal findings: Secondary | ICD-10-CM | POA: Diagnosis not present

## 2022-10-07 DIAGNOSIS — Z7185 Encounter for immunization safety counseling: Secondary | ICD-10-CM | POA: Diagnosis not present

## 2023-03-31 DIAGNOSIS — D225 Melanocytic nevi of trunk: Secondary | ICD-10-CM | POA: Diagnosis not present

## 2023-03-31 DIAGNOSIS — L821 Other seborrheic keratosis: Secondary | ICD-10-CM | POA: Diagnosis not present

## 2023-03-31 DIAGNOSIS — D2339 Other benign neoplasm of skin of other parts of face: Secondary | ICD-10-CM | POA: Diagnosis not present

## 2023-03-31 DIAGNOSIS — D485 Neoplasm of uncertain behavior of skin: Secondary | ICD-10-CM | POA: Diagnosis not present

## 2023-03-31 DIAGNOSIS — L814 Other melanin hyperpigmentation: Secondary | ICD-10-CM | POA: Diagnosis not present

## 2023-08-12 ENCOUNTER — Other Ambulatory Visit: Payer: Self-pay | Admitting: Family Medicine

## 2023-08-12 DIAGNOSIS — Z1231 Encounter for screening mammogram for malignant neoplasm of breast: Secondary | ICD-10-CM

## 2023-08-16 ENCOUNTER — Ambulatory Visit
Admission: RE | Admit: 2023-08-16 | Discharge: 2023-08-16 | Disposition: A | Discharge status: Home / Self Care | Source: Ambulatory Visit | Attending: Family Medicine

## 2023-08-16 DIAGNOSIS — Z1231 Encounter for screening mammogram for malignant neoplasm of breast: Secondary | ICD-10-CM

## 2023-08-17 ENCOUNTER — Encounter

## 2023-08-17 DIAGNOSIS — Z1231 Encounter for screening mammogram for malignant neoplasm of breast: Secondary | ICD-10-CM

## 2023-11-02 DIAGNOSIS — Z0000 Encounter for general adult medical examination without abnormal findings: Secondary | ICD-10-CM | POA: Diagnosis not present

## 2023-11-02 DIAGNOSIS — Z1211 Encounter for screening for malignant neoplasm of colon: Secondary | ICD-10-CM | POA: Diagnosis not present

## 2023-11-02 DIAGNOSIS — Z13220 Encounter for screening for lipoid disorders: Secondary | ICD-10-CM | POA: Diagnosis not present

## 2024-04-21 ENCOUNTER — Encounter

## 2024-05-05 ENCOUNTER — Encounter: Admitting: Internal Medicine
